# Patient Record
Sex: Female | Born: 1995 | Race: Black or African American | Hispanic: No | Marital: Single | Smoking: Current every day smoker
Health system: Southern US, Community
[De-identification: ages and names within clinical notes are randomized; demographics above are authoritative.]

---

## 2019-12-07 ENCOUNTER — Emergency Department (HOSPITAL_BASED_OUTPATIENT_CLINIC_OR_DEPARTMENT_OTHER)
Admission: EM | Admit: 2019-12-07 | Discharge: 2019-12-07 | Disposition: A | Discharge status: Home / Self Care | Payer: Self-pay | Attending: Emergency Medicine | Admitting: Emergency Medicine

## 2019-12-07 ENCOUNTER — Other Ambulatory Visit: Payer: Self-pay

## 2019-12-07 ENCOUNTER — Encounter (HOSPITAL_BASED_OUTPATIENT_CLINIC_OR_DEPARTMENT_OTHER): Payer: Self-pay

## 2019-12-07 DIAGNOSIS — F17210 Nicotine dependence, cigarettes, uncomplicated: Secondary | ICD-10-CM | POA: Insufficient documentation

## 2019-12-07 DIAGNOSIS — N939 Abnormal uterine and vaginal bleeding, unspecified: Secondary | ICD-10-CM | POA: Insufficient documentation

## 2019-12-07 LAB — BASIC METABOLIC PANEL
Anion gap: 8 (ref 5–15)
BUN: 13 mg/dL (ref 6–20)
CO2: 23 mmol/L (ref 22–32)
Calcium: 8.8 mg/dL — ABNORMAL LOW (ref 8.9–10.3)
Chloride: 106 mmol/L (ref 98–111)
Creatinine, Ser: 0.7 mg/dL (ref 0.44–1.00)
GFR calc Af Amer: >60 mL/min (ref >60)
GFR calc non Af Amer: >60 mL/min (ref >60)
Glucose, Bld: 82 mg/dL (ref 70–99)
Potassium: 4.4 mmol/L (ref 3.5–5.1)
Sodium: 137 mmol/L (ref 135–145)

## 2019-12-07 LAB — CBC WITH DIFFERENTIAL/PLATELET
Abs Immature Granulocytes: 0 10*3/uL (ref 0.00–0.07)
Basophils Absolute: 0 10*3/uL (ref 0.0–0.1)
Basophils Relative: 1 %
Eosinophils Absolute: 0.1 10*3/uL (ref 0.0–0.5)
Eosinophils Relative: 2 %
HCT: 40.2 % (ref 36.0–46.0)
Hemoglobin: 13.2 g/dL (ref 12.0–15.0)
Immature Granulocytes: 0 %
Lymphocytes Relative: 50 %
Lymphs Abs: 2 10*3/uL (ref 0.7–4.0)
MCH: 32.8 pg (ref 26.0–34.0)
MCHC: 32.8 g/dL (ref 30.0–36.0)
MCV: 100 fL (ref 80.0–100.0)
Monocytes Absolute: 0.3 10*3/uL (ref 0.1–1.0)
Monocytes Relative: 7 %
Neutro Abs: 1.6 10*3/uL — ABNORMAL LOW (ref 1.7–7.7)
Neutrophils Relative %: 40 %
Platelets: 268 10*3/uL (ref 150–400)
RBC: 4.02 MIL/uL (ref 3.87–5.11)
RDW: 13.1 % (ref 11.5–15.5)
WBC: 4 10*3/uL (ref 4.0–10.5)
nRBC: 0 % (ref 0.0–0.2)

## 2019-12-07 LAB — HCG, SERUM, QUALITATIVE: Preg, Serum: NEGATIVE

## 2019-12-07 NOTE — ED Provider Notes (Signed)
[facility] Provider Note   CSN: [id number] Arrival date & time: 12/07/19  1025     History Chief Complaint  Patient presents with  . Vaginal Bleeding    Naoko Diperna is a 23 y.o. female.  23 year old female presents with complaint of vaginal bleeding.  Patient states that she had a regular cycle at the end of October, had a brief 3-day cycle November 30 through December 2 which was unusual for her and then had return of bleeding today which was heavier with passage of clots.  Reports using 1 pad/hour today, not soaking through pads. Patient is G3, P1, does not believe she is pregnant however the bleeding reminded her of her previous miscarriage.  Patient is not currently on birth control, she is sexually active.  No prior transfusions.  No other complaints or concerns.        History reviewed. No pertinent past medical history.  There are no problems to display for this patient.   History reviewed. No pertinent surgical history.   OB History    Gravida  3   Para  1   Term      Preterm      AB      Living        SAB      TAB      Ectopic      Multiple      Live Births              History reviewed. No pertinent family history.  Social History   Tobacco Use  . Smoking status: Current Every Day Smoker  . Smokeless tobacco: Never Used  Substance Use Topics  . Alcohol use: Not Currently  . Drug use: Never    Home Medications Prior to Admission medications   Not on File    Allergies    Patient has no known allergies.  Review of Systems   Review of Systems  Constitutional: Negative for fever.  Respiratory: Negative for shortness of breath.   Gastrointestinal: Negative for abdominal pain.  Genitourinary: Positive for vaginal bleeding. Negative for pelvic pain.  Skin: Negative for wound.  Allergic/Immunologic: Negative for immunocompromised state.  Neurological: Negative for dizziness and  light-headedness.  Hematological: Does not bruise/bleed easily.  Psychiatric/Behavioral: Negative for confusion.  All other systems reviewed and are negative.   Physical Exam Updated Vital Signs BP 112/76 (BP Location: Right Arm)   Pulse 76   Temp 98.4 F (36.9 C) (Oral)   Resp 16   Ht 4\' 11"  (1.499 m)   Wt 43.1 kg   LMP 11/25/2019   SpO2 100%   BMI 19.19 kg/m   Physical Exam Vitals and nursing note reviewed. Exam conducted with a chaperone present.  Constitutional:      General: She is not in acute distress.    Appearance: She is well-developed. She is not diaphoretic.  HENT:     Head: Normocephalic and atraumatic.  Cardiovascular:     Rate and Rhythm: Normal rate and regular rhythm.     Pulses: Normal pulses.     Heart sounds: Normal heart sounds.  Pulmonary:     Effort: Pulmonary effort is normal.     Breath sounds: Normal breath sounds.  Abdominal:     Palpations: Abdomen is soft.     Tenderness: There is no abdominal tenderness.  Genitourinary:    Vagina: No signs of injury and foreign body. Bleeding present.     Cervix: No cervical  motion tenderness or cervical bleeding.     Uterus: Normal. Not enlarged and not tender.      Adnexa: Right adnexa normal and left adnexa normal.       Right: No mass, tenderness or fullness.         Left: No mass, tenderness or fullness.       Comments: Small amount of blood in vaginal vault with clot present, removed, no further bleeding. Skin:    General: Skin is warm and dry.  Neurological:     Mental Status: She is alert and oriented to person, place, and time.  Psychiatric:        Behavior: Behavior normal.     ED Results / Procedures / Treatments   Labs (all labs ordered are listed, but only abnormal results are displayed) Labs Reviewed  CBC WITH DIFFERENTIAL/PLATELET - Abnormal; Notable for the following components:      Result Value   Neutro Abs 1.6 (*)    All other components within normal limits  BASIC  METABOLIC PANEL - Abnormal; Notable for the following components:   Calcium 8.8 (*)    All other components within normal limits  HCG, SERUM, QUALITATIVE    EKG None  Radiology No results found.  Procedures Procedures (including critical care time)  Medications Ordered in ED Medications - No data to display  ED Course  I have reviewed the triage vital signs and the nursing notes.  Pertinent labs & imaging results that were available during my care of the patient were reviewed by me and considered in my medical decision making (see chart for details).  Clinical Course as of Dec 06 1320  Thu Dec 07, 2019  [id number] 23 year old female with complaint of heavy vaginal bleeding with clots.  On exam has mild mental blood with a clot in the vaginal vault which was removed, no further bleeding identified at this time.  Patient's vitals are within normal limits, CBC and BMP unremarkable, no evidence of acute anemia, hCG negative.  Patient is feeling well upon discharge planning to follow-up with women's clinic.  Return to ER for new or worsening symptoms.   [LM]    Clinical Course User Index [LM] Alden Hipp   MDM Rules/Calculators/A&P      Final Clinical Impression(s) / ED Diagnoses Final diagnoses:  Vaginal bleeding    Rx / DC Orders ED Discharge Orders    None       Jeannie Fend, PA-C 12/07/19 1321    Alvira Monday, MD 12/07/19 2249

## 2019-12-07 NOTE — ED Triage Notes (Signed)
Pt states ended regular menstrual cycle December 3rd.  Began bleeding heavily yesterday, with clots.  Going through a pad every hour.  Pt does report unprotected sexual activity.

## 2019-12-07 NOTE — Discharge Instructions (Addendum)
[facility] [address] [place], [place] [phone] Walk in GYN clinic 4PM-7:30PM Monday-Wednesday  

## 2020-09-11 ENCOUNTER — Emergency Department (HOSPITAL_BASED_OUTPATIENT_CLINIC_OR_DEPARTMENT_OTHER)
Admission: EM | Admit: 2020-09-11 | Discharge: 2020-09-11 | Disposition: A | Discharge status: Home / Self Care | Payer: Self-pay | Attending: Emergency Medicine | Admitting: Emergency Medicine

## 2020-09-11 ENCOUNTER — Encounter (HOSPITAL_BASED_OUTPATIENT_CLINIC_OR_DEPARTMENT_OTHER): Payer: Self-pay

## 2020-09-11 ENCOUNTER — Other Ambulatory Visit: Payer: Self-pay

## 2020-09-11 DIAGNOSIS — O26899 Other specified pregnancy related conditions, unspecified trimester: Secondary | ICD-10-CM | POA: Insufficient documentation

## 2020-09-11 DIAGNOSIS — Z5321 Procedure and treatment not carried out due to patient leaving prior to being seen by health care provider: Secondary | ICD-10-CM | POA: Insufficient documentation

## 2020-09-11 DIAGNOSIS — R109 Unspecified abdominal pain: Secondary | ICD-10-CM | POA: Insufficient documentation

## 2020-09-11 DIAGNOSIS — Z3A00 Weeks of gestation of pregnancy not specified: Secondary | ICD-10-CM | POA: Insufficient documentation

## 2020-09-11 LAB — COMPREHENSIVE METABOLIC PANEL
ALT: 12 U/L (ref 0–44)
AST: 19 U/L (ref 15–41)
Albumin: 4.3 g/dL (ref 3.5–5.0)
Alkaline Phosphatase: 51 U/L (ref 38–126)
Anion gap: 7 (ref 5–15)
BUN: 11 mg/dL (ref 6–20)
CO2: 24 mmol/L (ref 22–32)
Calcium: 9.1 mg/dL (ref 8.9–10.3)
Chloride: 100 mmol/L (ref 98–111)
Creatinine, Ser: 0.61 mg/dL (ref 0.44–1.00)
GFR calc Af Amer: >60 mL/min (ref >60)
GFR calc non Af Amer: >60 mL/min (ref >60)
Glucose, Bld: 111 mg/dL — ABNORMAL HIGH (ref 70–99)
Potassium: 3.4 mmol/L — ABNORMAL LOW (ref 3.5–5.1)
Sodium: 131 mmol/L — ABNORMAL LOW (ref 135–145)
Total Bilirubin: 0.4 mg/dL (ref 0.3–1.2)
Total Protein: 7.7 g/dL (ref 6.5–8.1)

## 2020-09-11 LAB — URINALYSIS, ROUTINE W REFLEX MICROSCOPIC
Bilirubin Urine: NEGATIVE
Glucose, UA: NEGATIVE mg/dL
Ketones, ur: >80 mg/dL — AB
Nitrite: NEGATIVE
Protein, ur: NEGATIVE mg/dL
Specific Gravity, Urine: >1.03 — ABNORMAL HIGH (ref 1.005–1.030)
pH: 6 (ref 5.0–8.0)

## 2020-09-11 LAB — URINALYSIS, MICROSCOPIC (REFLEX)

## 2020-09-11 LAB — CBC
HCT: 38.7 % (ref 36.0–46.0)
Hemoglobin: 13.1 g/dL (ref 12.0–15.0)
MCH: 33.2 pg (ref 26.0–34.0)
MCHC: 33.9 g/dL (ref 30.0–36.0)
MCV: 98 fL (ref 80.0–100.0)
Platelets: 305 10*3/uL (ref 150–400)
RBC: 3.95 MIL/uL (ref 3.87–5.11)
RDW: 12.6 % (ref 11.5–15.5)
WBC: 6.8 10*3/uL (ref 4.0–10.5)
nRBC: 0 % (ref 0.0–0.2)

## 2020-09-11 LAB — PREGNANCY, URINE: Preg Test, Ur: POSITIVE — AB

## 2020-09-11 LAB — LIPASE, BLOOD: Lipase: 23 U/L (ref 11–51)

## 2020-09-11 NOTE — ED Provider Notes (Signed)
I signed up for this patient but she subsequently left without being seen prior to my initial examination.  According to staff, she was hungry and complained about not being able to eat.     Lorelee New, PA-C 09/11/20 2008    Cathren Laine, MD 09/16/20 1009

## 2020-09-11 NOTE — ED Notes (Addendum)
PT seen leaving department. This tech was entering a room but heard the PT mumble dissatisfaction under her breath about not eating. RN informed PT of policy regarding Diet Orders.

## 2020-09-11 NOTE — ED Triage Notes (Signed)
Pt c/o left side abd pain x 1 week-denies n/v/d and vaginal d/c-states she had +preg home test-NAD-steady gait

## 2020-09-12 ENCOUNTER — Emergency Department (HOSPITAL_BASED_OUTPATIENT_CLINIC_OR_DEPARTMENT_OTHER): Payer: Self-pay

## 2020-09-12 ENCOUNTER — Emergency Department (HOSPITAL_BASED_OUTPATIENT_CLINIC_OR_DEPARTMENT_OTHER)
Admission: EM | Admit: 2020-09-12 | Discharge: 2020-09-12 | Disposition: A | Discharge status: Home / Self Care | Payer: Self-pay | Attending: Emergency Medicine | Admitting: Emergency Medicine

## 2020-09-12 ENCOUNTER — Other Ambulatory Visit: Payer: Self-pay

## 2020-09-12 ENCOUNTER — Encounter (HOSPITAL_BASED_OUTPATIENT_CLINIC_OR_DEPARTMENT_OTHER): Payer: Self-pay | Admitting: Emergency Medicine

## 2020-09-12 DIAGNOSIS — A599 Trichomoniasis, unspecified: Secondary | ICD-10-CM

## 2020-09-12 DIAGNOSIS — O99331 Smoking (tobacco) complicating pregnancy, first trimester: Secondary | ICD-10-CM | POA: Insufficient documentation

## 2020-09-12 DIAGNOSIS — O2341 Unspecified infection of urinary tract in pregnancy, first trimester: Secondary | ICD-10-CM | POA: Insufficient documentation

## 2020-09-12 DIAGNOSIS — O3481 Maternal care for other abnormalities of pelvic organs, first trimester: Secondary | ICD-10-CM | POA: Insufficient documentation

## 2020-09-12 DIAGNOSIS — N939 Abnormal uterine and vaginal bleeding, unspecified: Secondary | ICD-10-CM

## 2020-09-12 DIAGNOSIS — N390 Urinary tract infection, site not specified: Secondary | ICD-10-CM

## 2020-09-12 DIAGNOSIS — Z79899 Other long term (current) drug therapy: Secondary | ICD-10-CM | POA: Insufficient documentation

## 2020-09-12 DIAGNOSIS — Z3A01 Less than 8 weeks gestation of pregnancy: Secondary | ICD-10-CM | POA: Insufficient documentation

## 2020-09-12 DIAGNOSIS — N83202 Unspecified ovarian cyst, left side: Secondary | ICD-10-CM

## 2020-09-12 DIAGNOSIS — O98311 Other infections with a predominantly sexual mode of transmission complicating pregnancy, first trimester: Secondary | ICD-10-CM | POA: Insufficient documentation

## 2020-09-12 DIAGNOSIS — O200 Threatened abortion: Secondary | ICD-10-CM

## 2020-09-12 DIAGNOSIS — R1032 Left lower quadrant pain: Secondary | ICD-10-CM

## 2020-09-12 DIAGNOSIS — F17200 Nicotine dependence, unspecified, uncomplicated: Secondary | ICD-10-CM | POA: Insufficient documentation

## 2020-09-12 LAB — URINALYSIS, ROUTINE W REFLEX MICROSCOPIC
Bilirubin Urine: NEGATIVE
Glucose, UA: NEGATIVE mg/dL
Ketones, ur: NEGATIVE mg/dL
Nitrite: NEGATIVE
Protein, ur: NEGATIVE mg/dL
Specific Gravity, Urine: 1.025 (ref 1.005–1.030)
pH: 6.5 (ref 5.0–8.0)

## 2020-09-12 LAB — ABO/RH: ABO/RH(D): A POS

## 2020-09-12 LAB — URINALYSIS, MICROSCOPIC (REFLEX): WBC, UA: >50 WBC/hpf (ref 0–5)

## 2020-09-12 LAB — HCG, QUANTITATIVE, PREGNANCY: hCG, Beta Chain, Quant, S: 19334 m[IU]/mL — ABNORMAL HIGH (ref <5)

## 2020-09-12 MED ORDER — CEPHALEXIN 250 MG PO CAPS
250.0000 mg | ORAL_CAPSULE | Freq: Once | ORAL | Status: AC
  Administered 2020-09-12: 250 mg via ORAL
  Filled 2020-09-12: qty 1

## 2020-09-12 MED ORDER — METRONIDAZOLE 500 MG PO TABS
2000.0000 mg | ORAL_TABLET | Freq: Once | ORAL | Status: AC
  Administered 2020-09-12: 2000 mg via ORAL
  Filled 2020-09-12: qty 4

## 2020-09-12 MED ORDER — CEPHALEXIN 500 MG PO CAPS: 500.0000 mg | ORAL_CAPSULE | Freq: Two times a day (BID) | ORAL | 0 refills | Status: AC

## 2020-09-12 NOTE — ED Notes (Signed)
Pt discharged to home. Discharge instructions have been discussed with patient and/or family members. Pt verbally acknowledges understanding d/c instructions, and endorses comprehension to checkout at registration before leaving.  °

## 2020-09-12 NOTE — ED Provider Notes (Signed)
[facility] EMERGENCY DEPARTMENT Provider Note   CSN: [id number] Arrival date & time: 09/12/20  1008     History Chief Complaint  Patient presents with  . Vaginal Bleeding  . Abdominal Pain    Allison Chang is a 24 y.o. female.  The history is provided by the patient and medical records. No language interpreter was used.  Vaginal Bleeding Associated symptoms: abdominal pain   Abdominal Pain Associated symptoms: vaginal bleeding    Allison Chang is a 24 y.o. female who presents to the Emergency Department complaining of abdominal pain and vaginal bleeding. She is a G2 P1 that presents the emergency department complaining of left lower quadrant abdominal pain and vaginal spotting. Symptoms began on September 9. Spotting has since resolved. LMP was August 3. She took three home pregnancy test and they were positive. She has associated nausea and vomiting yesterday. She has mild headache. They reports of fever, diarrhea, dysuria, vaginal discharge. She does not have an OB/GYN and has not been evaluated for this pregnancy yet. Unknown blood type.    History reviewed. No pertinent past medical history.  There are no problems to display for this patient.   History reviewed. No pertinent surgical history.   OB History    Gravida  3   Para  1   Term      Preterm      AB      Living        SAB      TAB      Ectopic      Multiple      Live Births              No family history on file.  Social History   Tobacco Use  . Smoking status: Current Every Day Smoker  . Smokeless tobacco: Never Used  Vaping Use  . Vaping Use: Never used  Substance Use Topics  . Alcohol use: Not Currently  . Drug use: Never    Home Medications Prior to Admission medications   Medication Sig Start Date End Date Taking? Authorizing Provider  cephALEXin (KEFLEX) 500 MG capsule Take 1 capsule (500 mg total) by mouth 2 (two) times daily. 09/12/20   Tilden Fossa, MD     Allergies    Patient has no known allergies.  Review of Systems   Review of Systems  Gastrointestinal: Positive for abdominal pain.  Genitourinary: Positive for vaginal bleeding.  All other systems reviewed and are negative.   Physical Exam Updated Vital Signs BP 99/64 (BP Location: Right Arm)   Pulse 76   Temp 98.1 F (36.7 C) (Oral)   Resp 16   Ht 4\' 11"  (1.499 m)   Wt 48.1 kg   LMP 07/30/2020   SpO2 100%   BMI 21.41 kg/m   Physical Exam Vitals and nursing note reviewed.  Constitutional:      Appearance: She is well-developed.  HENT:     Head: Normocephalic and atraumatic.  Cardiovascular:     Rate and Rhythm: Normal rate and regular rhythm.  Pulmonary:     Effort: Pulmonary effort is normal. No respiratory distress.  Abdominal:     Palpations: Abdomen is soft.     Tenderness: There is no abdominal tenderness. There is no guarding or rebound.  Musculoskeletal:        General: No tenderness.  Skin:    General: Skin is warm and dry.  Neurological:     Mental Status: She is alert and  oriented to person, place, and time.  Psychiatric:        Behavior: Behavior normal.     ED Results / Procedures / Treatments   Labs (all labs ordered are listed, but only abnormal results are displayed) Labs Reviewed  URINALYSIS, ROUTINE W REFLEX MICROSCOPIC - Abnormal; Notable for the following components:      Result Value   APPearance CLOUDY (*)    Hgb urine dipstick TRACE (*)    Leukocytes,Ua SMALL (*)    All other components within normal limits  HCG, QUANTITATIVE, PREGNANCY - Abnormal; Notable for the following components:   hCG, Beta Chain, Quant, S 19,334 (*)    All other components within normal limits  URINALYSIS, MICROSCOPIC (REFLEX) - Abnormal; Notable for the following components:   Bacteria, UA RARE (*)    Trichomonas, UA PRESENT (*)    All other components within normal limits  URINE CULTURE  ABO/RH  GC/CHLAMYDIA PROBE AMP ([place]) NOT AT [facility]      EKG None  Radiology US OB LESS THAN 14 WEEKS WITH OB TRANSVAGINAL  Result Date: 09/12/2020 CLINICAL DATA:  LEFT lower quadrant pain and bleeding for 1 week. Six weeks 2 days gestation by last menstrual period, last menstrual period of 07/30/2020. Beta hCG not available at the time of scan. EXAM: OBSTETRIC <14 WK [place] AND TRANSVAGINAL OB US TECHNIQUE: Both transabdominal and transvaginal ultrasound examinations were performed for complete evaluation of the gestation as well as the maternal uterus, adnexal regions, and pelvic cul-de-sac. Transvaginal technique was performed to assess early pregnancy. COMPARISON:  None FINDINGS: Intrauterine gestational sac: Single Yolk sac:  Visualized Embryo:  Visualized Cardiac Activity: Visualized Heart Rate: 97 bpm CRL: 4.6 mm   6 w   1 d                  [place] EDC: 05/07/2021 Subchorionic hemorrhage:  None Maternal uterus/adnexae: Cervix is normal. Small amount of echogenic blood in the endometrial canal. Cyst arising from the LEFT ovary measures 3.8 x 3.8 x 3.3 cm. No free fluid in the pelvis. RIGHT ovary not visualized.  No adnexal mass noted IMPRESSION: 1. Single intrauterine pregnancy with heart rate of 97 beats per minute at estimated gestational age of [redacted] weeks 1 day by crown-rump length. Heart rate is borderline, mildly bradycardic, of uncertain significance in the current setting. Consider short interval/continued follow-up to ensure viability and assess for continued bradycardia as clinically warranted. 2. LEFT ovarian cyst. 3. RIGHT ovary not visualized. Electronically Signed   By: Donzetta Kohut M.D.   On: 09/12/2020 12:33    Procedures Procedures (including critical care time)  Medications Ordered in ED Medications  cephALEXin (KEFLEX) capsule 250 mg (has no administration in time range)  metroNIDAZOLE (FLAGYL) tablet 2,000 mg (has no administration in time range)    ED Course  I have reviewed the triage vital signs and the nursing  notes.  Pertinent labs & imaging results that were available during my care of the patient were reviewed by me and considered in my medical decision making (see chart for details).    MDM Rules/Calculators/A&P                         Patient here for evaluation of spotting one week ago, positive pregnancy test as well as left lower quadrant pain. She is non-toxic appearing on evaluation with no significant tenderness on exam. UA is positive for trichomonas as well as numerous WBCs  and rare bacteria. Will treat in setting of her current symptoms. Pelvic ultrasound demonstrates IUP at six weeks and one day as well as left ovarian cyst. Heart rate is of borderline bradycardia. Discussed with on-call OB/GYN, recommends follow-up in 10 to 14 days. Discussed with patient findings of studies. Discussed OB/GYN follow-up as well as return precautions.  Final Clinical Impression(s) / ED Diagnoses Final diagnoses:  Threatened miscarriage in early pregnancy  Trichomonal infection  Acute lower UTI (urinary tract infection)  Cyst of left ovary    Rx / DC Orders ED Discharge Orders         Ordered    cephALEXin (KEFLEX) 500 MG capsule  2 times daily        09/12/20 1304           Tilden Fossa, MD 09/12/20 1309

## 2020-09-12 NOTE — ED Notes (Signed)
Pt self collected swab for GC/Chlamydia

## 2020-09-12 NOTE — Discharge Instructions (Signed)
You had an ultrasound performed in the emergency department today that showed a pregnancy that is six weeks and one day along today. The baby's heart rate was a little slow and this will need to be rechecked in the next 1 to 2 weeks. Please follow-up to establish an OB/GYN.

## 2020-09-12 NOTE — ED Triage Notes (Signed)
Pt reports headache , abd pain , vaginal bleeding. Positive preg test 1 week ago. Unable to get OB appointment.

## 2020-09-13 LAB — URINE CULTURE: Culture: NO GROWTH

## 2020-09-13 LAB — GC/CHLAMYDIA PROBE AMP (~~LOC~~) NOT AT ARMC
Chlamydia: NEGATIVE
Comment: NEGATIVE
Comment: NORMAL
Neisseria Gonorrhea: NEGATIVE

## 2021-10-12 IMAGING — US US OB < 14 WEEKS - US OB TV
1 series · 13 of 28 positions shown · non-contrast
Comparison: None

CLINICAL DATA: LEFT lower quadrant pain and bleeding for 1 week.
Six weeks 2 days gestation by last menstrual period, last menstrual
period of 07/30/2020. Beta hCG not available at the time of scan.

EXAM:
OBSTETRIC <14 WK US AND TRANSVAGINAL OB US
TECHNIQUE: Both transabdominal and transvaginal ultrasound examinations were
performed for complete evaluation of the gestation as well as the
maternal uterus, adnexal regions, and pelvic cul-de-sac.
Transvaginal technique was performed to assess early pregnancy.

[Series 1: us ob < 14 weeks - us ob tv · 13 of 37 slices shown]
[im 2/37]
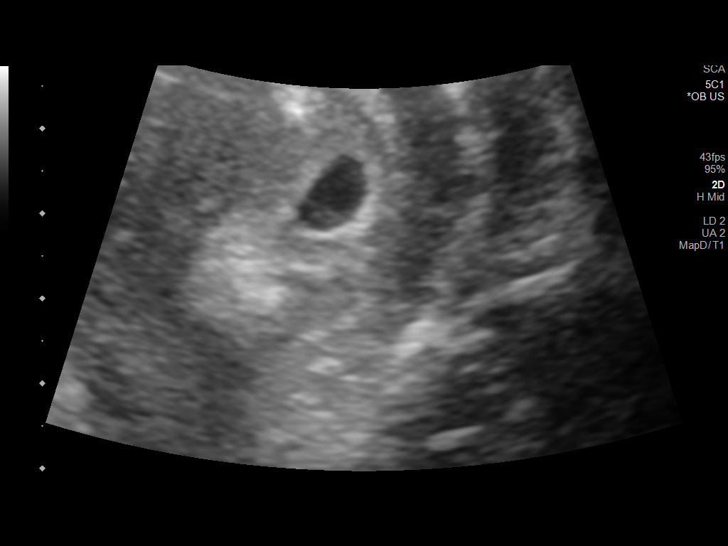
[im 5/37]
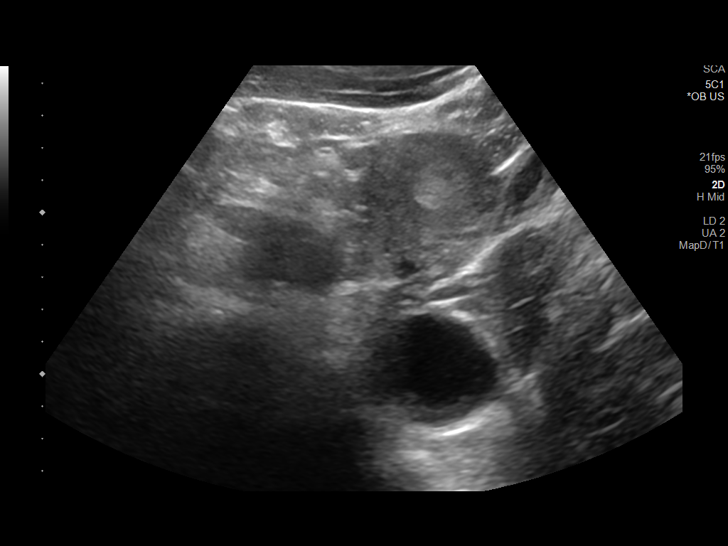
[im 7/37]
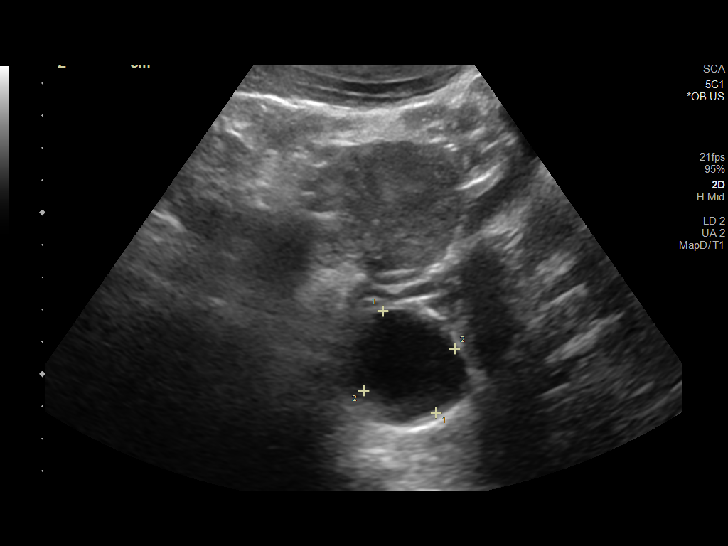
[im 10/37]
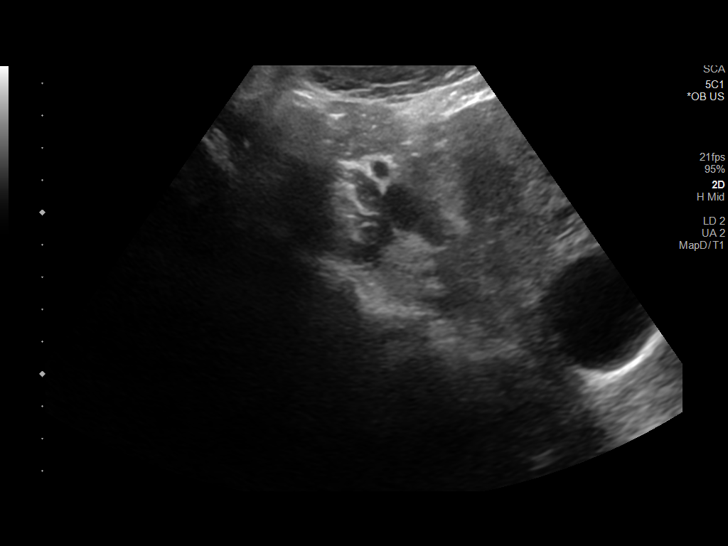
[im 13/37]
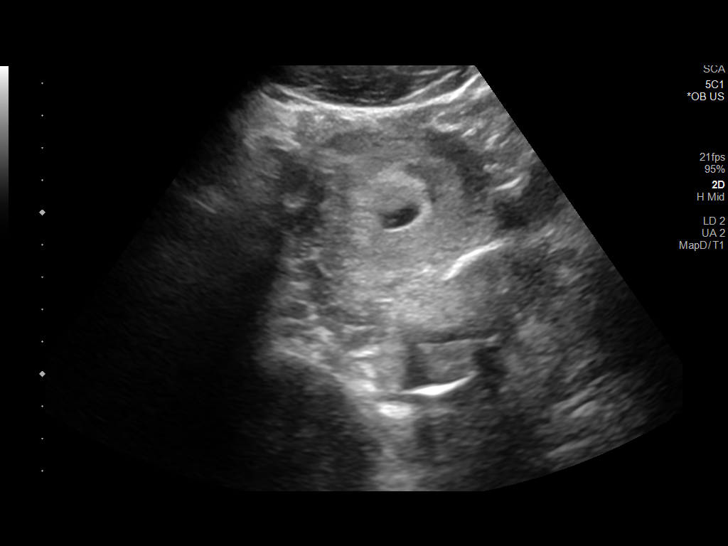
[im 15/37]
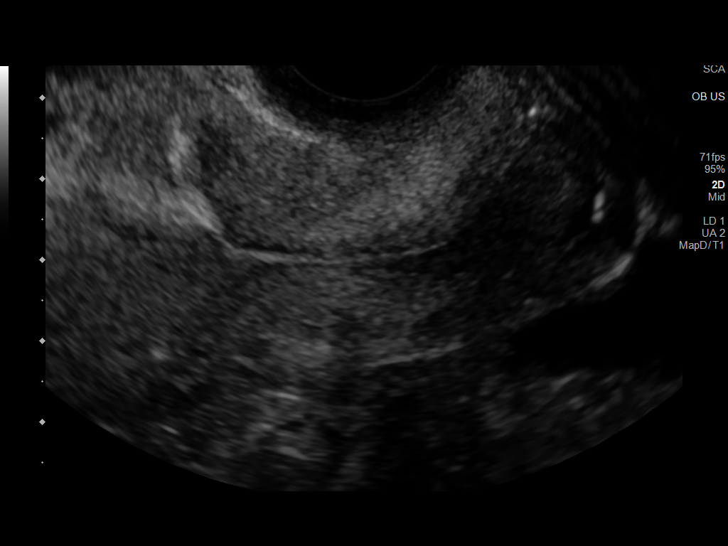
[im 19/37]
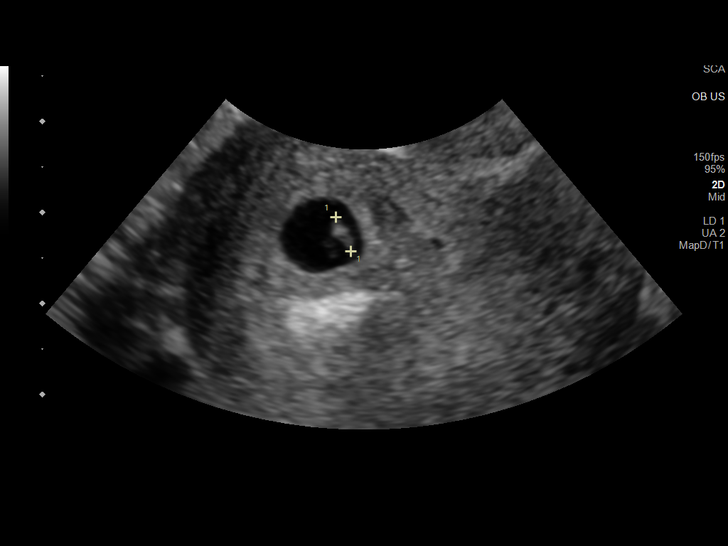
[im 22/37]
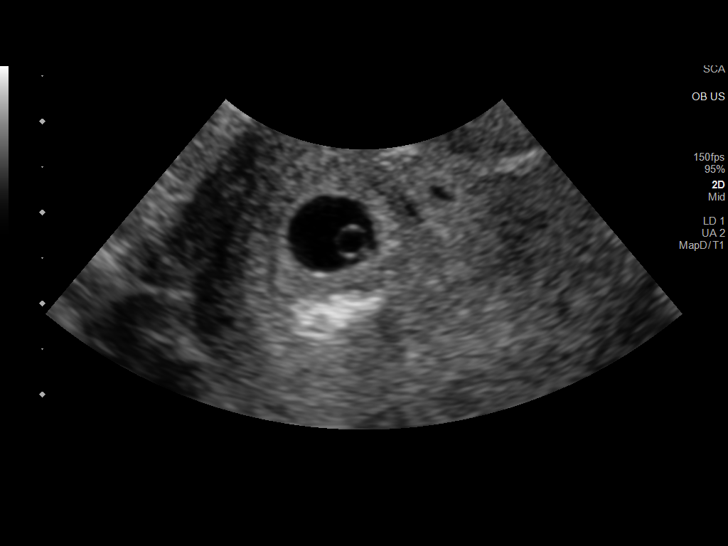
[im 25/37]
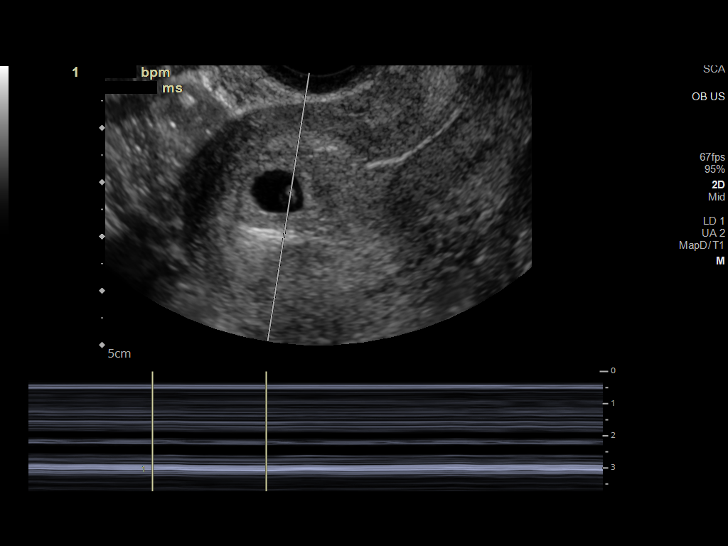
[im 27/37]
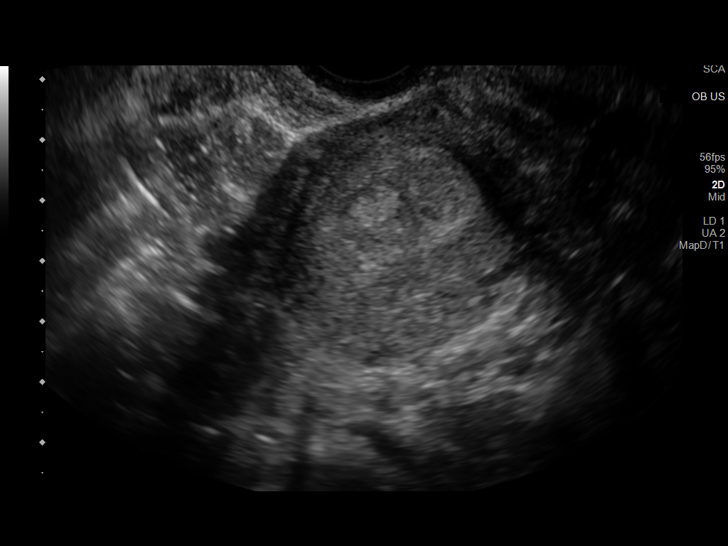
[im 30/37]
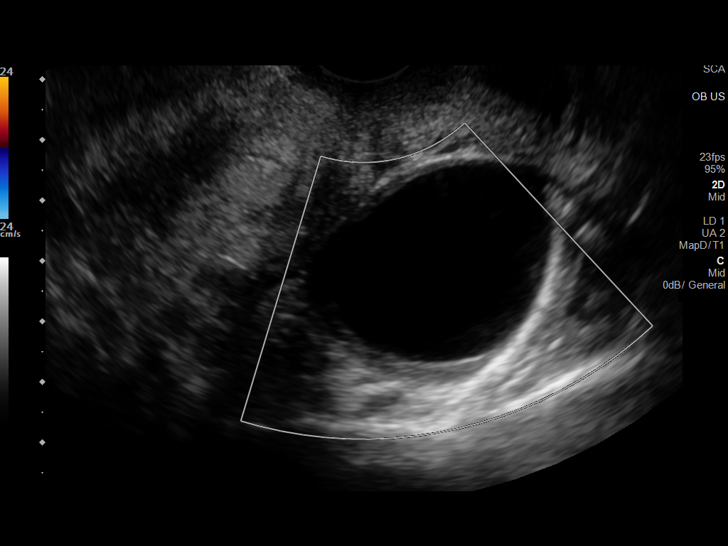
[im 33/37]
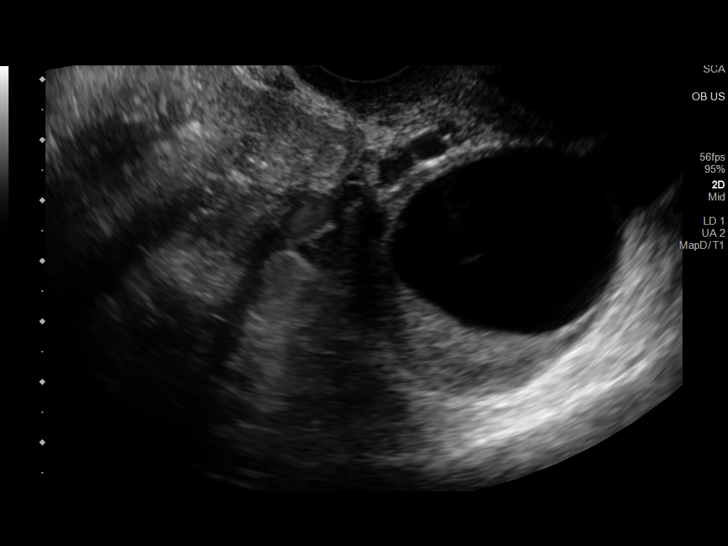
[im 35/37]
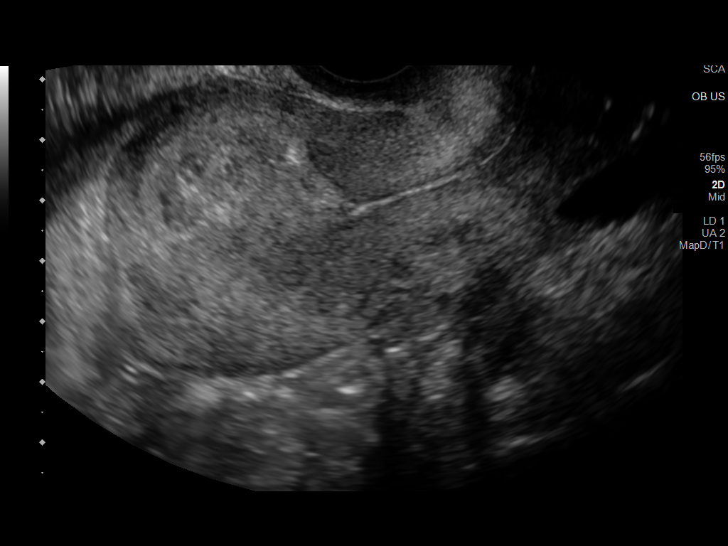

[13 of 28 positions shown; findings below may reference images not displayed]

FINDINGS: Intrauterine gestational sac: Single

Yolk sac:  Visualized

Embryo:  Visualized

Cardiac Activity: Visualized

Heart Rate: 97 bpm

CRL: 4.6 mm   6 w   1 d                  US EDC: 05/07/2021

Subchorionic hemorrhage:  None

Maternal uterus/adnexae: Cervix is normal.

Small amount of echogenic blood in the endometrial canal.

Cyst arising from the LEFT ovary measures 3.8 x 3.8 x 3.3 cm.

No free fluid in the pelvis.

RIGHT ovary not visualized.  No adnexal mass noted
IMPRESSION: 1. Single intrauterine pregnancy with heart rate of 97 beats per
minute at estimated gestational age of 6 weeks 1 day by crown-rump
length. Heart rate is borderline, mildly bradycardic, of uncertain
significance in the current setting. Consider short
interval/continued follow-up to ensure viability and assess for
continued bradycardia as clinically warranted.
2. LEFT ovarian cyst.
3. RIGHT ovary not visualized.
